# Patient Record
Sex: Female | Born: 1995 | Race: Black or African American | Hispanic: No | Marital: Single | Smoking: Never smoker
Health system: Southern US, Community
[De-identification: ages and names within clinical notes are randomized; demographics above are authoritative.]

## PROBLEM LIST (undated history)

## (undated) DIAGNOSIS — R7303 Prediabetes: Secondary | ICD-10-CM

## (undated) HISTORY — DX: Prediabetes: R73.03

## (undated) HISTORY — PX: ANTERIOR CRUCIATE LIGAMENT REPAIR: SHX115

## (undated) HISTORY — PX: CHOLECYSTECTOMY: SHX55

---

## 2021-06-05 ENCOUNTER — Encounter: Payer: Self-pay | Admitting: Emergency Medicine

## 2021-06-05 ENCOUNTER — Emergency Department: Payer: BC Managed Care – PPO

## 2021-06-05 ENCOUNTER — Other Ambulatory Visit: Payer: Self-pay

## 2021-06-05 DIAGNOSIS — U071 COVID-19: Secondary | ICD-10-CM | POA: Diagnosis not present

## 2021-06-05 DIAGNOSIS — R0602 Shortness of breath: Secondary | ICD-10-CM | POA: Diagnosis present

## 2021-06-05 LAB — LACTIC ACID, PLASMA: Lactic Acid, Venous: 1.6 mmol/L (ref 0.5–1.9)

## 2021-06-05 LAB — BASIC METABOLIC PANEL
Anion gap: 5 (ref 5–15)
BUN: 8 mg/dL (ref 6–20)
CO2: 25 mmol/L (ref 22–32)
Calcium: 8.7 mg/dL — ABNORMAL LOW (ref 8.9–10.3)
Chloride: 108 mmol/L (ref 98–111)
Creatinine, Ser: 0.69 mg/dL (ref 0.44–1.00)
GFR, Estimated: >60 mL/min (ref >60)
Glucose, Bld: 116 mg/dL — ABNORMAL HIGH (ref 70–99)
Potassium: 3.3 mmol/L — ABNORMAL LOW (ref 3.5–5.1)
Sodium: 138 mmol/L (ref 135–145)

## 2021-06-05 LAB — CBC
HCT: 28.1 % — ABNORMAL LOW (ref 36.0–46.0)
Hemoglobin: 8.1 g/dL — ABNORMAL LOW (ref 12.0–15.0)
MCH: 19.8 pg — ABNORMAL LOW (ref 26.0–34.0)
MCHC: 28.8 g/dL — ABNORMAL LOW (ref 30.0–36.0)
MCV: 68.5 fL — ABNORMAL LOW (ref 80.0–100.0)
Platelets: 379 10*3/uL (ref 150–400)
RBC: 4.1 MIL/uL (ref 3.87–5.11)
RDW: 17.8 % — ABNORMAL HIGH (ref 11.5–15.5)
WBC: 7.2 10*3/uL (ref 4.0–10.5)
nRBC: 0 % (ref 0.0–0.2)

## 2021-06-05 LAB — TROPONIN I (HIGH SENSITIVITY): Troponin I (High Sensitivity): 3 ng/L (ref <18)

## 2021-06-05 NOTE — ED Triage Notes (Signed)
Pt arrived via POV with reports of COVID + on 6/13, pt states sxs began 6/12, pt c/o shortness of breath at rest and with exertion worse since last night. Pt states she used albuterol nebs with no improvement.  Pt reports fevers at home 102 yesterday.   Pt c/o chest tightness as well.  Pt able to speak in complete sentences without running out of breath.

## 2021-06-06 ENCOUNTER — Emergency Department
Admission: EM | Admit: 2021-06-06 | Discharge: 2021-06-06 | Disposition: A | Discharge status: Home / Self Care | Payer: BC Managed Care – PPO | Attending: Emergency Medicine | Admitting: Emergency Medicine

## 2021-06-06 DIAGNOSIS — R0602 Shortness of breath: Secondary | ICD-10-CM

## 2021-06-06 DIAGNOSIS — U071 COVID-19: Secondary | ICD-10-CM

## 2021-06-06 NOTE — ED Notes (Signed)
Patient denies pain and is resting comfortably.  

## 2021-06-06 NOTE — ED Provider Notes (Signed)
[facility] Emergency Department Provider Note   ____________________________________________   Event Date/Time   First MD Initiated Contact with Patient 06/06/21 0133     (approximate)  I have reviewed the triage vital signs and the nursing notes.   HISTORY  Chief Complaint Shortness of Breath and Covid Positive (Home test on 06/02/21)    HPI Jodi Mckay is a 25 y.o. female with the below stated past medical history presents for shortness of breath in the setting of recent COVID diagnosis.  Patient states that she has had worsening cough, fever, and shortness of breath since 6/12.  Patient was seen via a televisit on the 13th and prescribed Paxlovid.  Patient states that despite taking this medication on time and as prescribed, her symptoms have continued to worsen.  Patient states that they acutely worsened tonight and she presented to the emergency department.  Patient also endorses associated nausea/vomiting/diarrhea that has resolved.  Patient currently denies any vision changes, tinnitus, difficulty speaking, facial droop, sore throat, chest pain, abdominal pain, dysuria, or weakness/numbness/paresthesias in any extremity         Past Medical History:  Diagnosis Date   Prediabetes     There are no problems to display for this patient.   Past Surgical History:  Procedure Laterality Date   ANTERIOR CRUCIATE LIGAMENT REPAIR     CHOLECYSTECTOMY      Prior to Admission medications   Not on File    Allergies Patient has no known allergies.  No family history on file.  Social History Social History   Tobacco Use   Smoking status: Never   Smokeless tobacco: Never  Vaping Use   Vaping Use: Never used    Review of Systems Constitutional: No fever/chills Eyes: No visual changes. ENT: No sore throat. Cardiovascular: Denies chest pain. Respiratory: Endorses shortness of breath. Gastrointestinal: No abdominal pain.  Endorses  nausea/vomiting/diarrhea that has resolved Genitourinary: Negative for dysuria. Musculoskeletal: Endorses generalized arthralgias Skin: Negative for rash. Neurological: Negative for headaches, weakness/numbness/paresthesias in any extremity Psychiatric: Negative for suicidal ideation/homicidal ideation   ____________________________________________   PHYSICAL EXAM:  VITAL SIGNS: ED Triage Vitals  Enc Vitals Group     BP 06/05/21 2304 123/79     Pulse Rate 06/05/21 2304 76     Resp 06/05/21 2304 18     Temp 06/05/21 2304 98.2 F (36.8 C)     Temp Source 06/05/21 2304 Oral     SpO2 06/05/21 2304 100 %     Weight 06/05/21 2310 (!) 310 lb (140.6 kg)     Height 06/05/21 2310 5\' 4"  (1.626 m)     Head Circumference --      Peak Flow --      Pain Score 06/05/21 2310 9     Pain Loc --      Pain Edu? --      Excl. in GC? --    Constitutional: Alert and oriented. Well appearing obese African-American female in no acute distress. Eyes: Conjunctivae are normal. PERRL. Head: Atraumatic. Nose: No congestion/rhinnorhea. Mouth/Throat: Mucous membranes are moist. Neck: No stridor Cardiovascular: Grossly normal heart sounds.  Good peripheral circulation. Respiratory: Normal respiratory effort.  No retractions.  Clear to auscultation bilaterally Gastrointestinal: Soft and nontender. No distention. Musculoskeletal: No obvious deformities Neurologic:  Normal speech and language. No gross focal neurologic deficits are appreciated. Skin:  Skin is warm and dry. No rash noted. Psychiatric: Mood and affect are normal. Speech and behavior are normal.  ____________________________________________  LABS (all labs ordered are listed, but only abnormal results are displayed)  Labs Reviewed  BASIC METABOLIC PANEL - Abnormal; Notable for the following components:      Result Value   Potassium 3.3 (*)    Glucose, Bld 116 (*)    Calcium 8.7 (*)    All other components within normal limits  CBC  - Abnormal; Notable for the following components:   Hemoglobin 8.1 (*)    HCT 28.1 (*)    MCV 68.5 (*)    MCH 19.8 (*)    MCHC 28.8 (*)    RDW 17.8 (*)    All other components within normal limits  LACTIC ACID, PLASMA  POC URINE PREG, ED  TROPONIN I (HIGH SENSITIVITY)  TROPONIN I (HIGH SENSITIVITY)   ____________________________________________  EKG  ED ECG REPORT I, Merwyn Katos, the attending physician, personally viewed and interpreted this ECG.  Date: 06/06/2021 EKG Time: 2310 Rate: 75 Rhythm: normal sinus rhythm QRS Axis: normal Intervals: normal ST/T Wave abnormalities: normal Narrative Interpretation: no evidence of acute ischemia  ____________________________________________  RADIOLOGY  ED MD interpretation: 2 view chest x-ray shows no evidence of acute abnormalities including no pneumonia, pneumothorax, or widened mediastinum  Official radiology report(s): DG Chest 2 View  Result Date: 06/05/2021 CLINICAL DATA:  Chest pain and shortness of breath.  Positive COVID. EXAM: CHEST - 2 VIEW COMPARISON:  None. FINDINGS: The heart size and mediastinal contours are within normal limits. Both lungs are clear. The visualized skeletal structures are unremarkable. IMPRESSION: No active cardiopulmonary disease. Electronically Signed   By: Burman Nieves M.D.   On: 06/05/2021 23:33    ____________________________________________   PROCEDURES  Procedure(s) performed (including Critical Care):  .1-3 Lead EKG Interpretation  Date/Time: 06/06/2021 1:49 AM Performed by: Merwyn Katos, MD Authorized by: Merwyn Katos, MD     Interpretation: normal     ECG rate:  77   ECG rate assessment: normal     Rhythm: sinus rhythm     Ectopy: none     Conduction: normal     ____________________________________________   INITIAL IMPRESSION / ASSESSMENT AND PLAN / ED COURSE  As part of my medical decision making, I reviewed the following data within the electronic  MEDICAL RECORD NUMBER Nursing notes reviewed and incorporated, Labs reviewed, EKG interpreted, Old chart reviewed, Radiograph reviewed and Notes from prior ED visits reviewed and incorporated        Presentation most consistent with Viral Syndrome.  Patient has tested positive for COVID-19 prior to arrival. Based on vitals and exam they are nontoxic and stable for discharge.  Given History and Exam I have a lower suspicion for: Emergent CardioPulmonary causes [such as Acute Asthma or COPD Exacerbation, acute Heart Failure or exacerbation, PE, PTX, atypical ACS, PNA]. Emergent Otolaryngeal causes [such as PTA, RPA, Ludwigs, Epiglottitis, EBV].  Regarding Emergent Travel or Immunosuppressive related infectious: I have a low suspicion for acute HIV.  Will provide strict return precautions and instructions on self-isolation/quarantine and anticipatory guidance.      ____________________________________________   FINAL CLINICAL IMPRESSION(S) / ED DIAGNOSES  Final diagnoses:  COVID-19 virus infection  SOB (shortness of breath)     ED Discharge Orders     None        Note:  This document was prepared using Dragon voice recognition software and may include unintentional dictation errors.    Merwyn Katos, MD 06/06/21 [phone]

## 2022-04-10 ENCOUNTER — Other Ambulatory Visit: Payer: Self-pay | Admitting: Obstetrics and Gynecology

## 2022-04-10 DIAGNOSIS — N644 Mastodynia: Secondary | ICD-10-CM

## 2022-04-27 ENCOUNTER — Other Ambulatory Visit: Payer: BC Managed Care – PPO

## 2022-04-29 ENCOUNTER — Other Ambulatory Visit: Payer: BC Managed Care – PPO

## 2022-05-04 ENCOUNTER — Ambulatory Visit
Admission: RE | Admit: 2022-05-04 | Discharge: 2022-05-04 | Disposition: A | Discharge status: Home / Self Care | Payer: BC Managed Care – PPO | Source: Ambulatory Visit | Attending: Obstetrics and Gynecology | Admitting: Obstetrics and Gynecology

## 2022-05-04 DIAGNOSIS — N644 Mastodynia: Secondary | ICD-10-CM | POA: Insufficient documentation

## 2022-06-30 ENCOUNTER — Other Ambulatory Visit: Payer: Self-pay | Admitting: Physician Assistant

## 2022-06-30 ENCOUNTER — Ambulatory Visit
Admission: RE | Admit: 2022-06-30 | Discharge: 2022-06-30 | Disposition: A | Discharge status: Home / Self Care | Payer: BC Managed Care – PPO | Source: Ambulatory Visit | Attending: Physician Assistant | Admitting: Physician Assistant

## 2022-06-30 DIAGNOSIS — R071 Chest pain on breathing: Secondary | ICD-10-CM | POA: Diagnosis present

## 2022-06-30 DIAGNOSIS — R0789 Other chest pain: Secondary | ICD-10-CM | POA: Diagnosis present

## 2022-06-30 DIAGNOSIS — M549 Dorsalgia, unspecified: Secondary | ICD-10-CM

## 2022-06-30 MED ORDER — IOHEXOL 350 MG/ML SOLN
75.0000 mL | Freq: Once | INTRAVENOUS | Status: AC | PRN
  Administered 2022-06-30: 75 mL via INTRAVENOUS

## 2022-07-09 ENCOUNTER — Other Ambulatory Visit: Payer: Self-pay | Admitting: Family Medicine

## 2022-07-09 DIAGNOSIS — N6312 Unspecified lump in the right breast, upper inner quadrant: Secondary | ICD-10-CM

## 2022-07-28 ENCOUNTER — Other Ambulatory Visit: Payer: BC Managed Care – PPO

## 2022-07-30 ENCOUNTER — Other Ambulatory Visit: Payer: BC Managed Care – PPO

## 2022-08-03 ENCOUNTER — Ambulatory Visit
Admission: RE | Admit: 2022-08-03 | Discharge: 2022-08-03 | Disposition: A | Discharge status: Home / Self Care | Payer: BC Managed Care – PPO | Source: Ambulatory Visit | Attending: Family Medicine | Admitting: Family Medicine

## 2022-08-03 DIAGNOSIS — N6312 Unspecified lump in the right breast, upper inner quadrant: Secondary | ICD-10-CM | POA: Diagnosis present

## 2022-08-19 ENCOUNTER — Ambulatory Visit: Payer: BC Managed Care – PPO | Admitting: Physical Therapy

## 2022-08-25 ENCOUNTER — Encounter: Payer: BC Managed Care – PPO | Admitting: Physical Therapy

## 2022-08-27 ENCOUNTER — Encounter: Payer: BC Managed Care – PPO | Admitting: Physical Therapy

## 2022-08-31 ENCOUNTER — Encounter: Payer: BC Managed Care – PPO | Admitting: Physical Therapy

## 2022-09-04 ENCOUNTER — Ambulatory Visit: Payer: BC Managed Care – PPO | Attending: Family Medicine | Admitting: Physical Therapy

## 2022-09-04 ENCOUNTER — Encounter: Payer: BC Managed Care – PPO | Admitting: Physical Therapy

## 2022-09-04 DIAGNOSIS — M546 Pain in thoracic spine: Secondary | ICD-10-CM | POA: Insufficient documentation

## 2022-09-04 DIAGNOSIS — M542 Cervicalgia: Secondary | ICD-10-CM | POA: Diagnosis present

## 2022-09-04 NOTE — Therapy (Signed)
OUTPATIENT PHYSICAL THERAPY THORACOLUMBAR EVALUATION   Patient Name: Jodi Mckay MRN: [id number] DOB:1996-10-10, 26 y.o., female Today's Date: 09/04/2022   PT End of Session - 09/04/22 1018     Visit Number 1    Number of Visits 17    Date for PT Re-Evaluation 11/06/22    Authorization - Visit Number 1    Authorization - Number of Visits 10    PT Start Time 1007    PT Stop Time 1045    PT Time Calculation (min) 38 min    Activity Tolerance Patient tolerated treatment well    Behavior During Therapy WFL for tasks assessed/performed             Past Medical History:  Diagnosis Date   Prediabetes    Past Surgical History:  Procedure Laterality Date   ANTERIOR CRUCIATE LIGAMENT REPAIR     CHOLECYSTECTOMY     There are no problems to display for this patient.   PCP: Nemiah Commander MD  REFERRING PROVIDER: Meeler FNP  REFERRING DIAG: midback pain   Rationale for Evaluation and Treatment Rehabilitation  THERAPY DIAG:  Pain in thoracic spine  Cervicalgia  ONSET DATE: July 1st 2023  SUBJECTIVE:                                                                                                                                                                                           SUBJECTIVE STATEMENT: Upper mid back spasms insidious onset  PERTINENT HISTORY:  Pt is a 26 year old female presenting with mid back spasms starting July 1 insidious onset. Pt reports she saw MD for this, and trouble breathing, diagnosis of costochondritis- reports this has resolved to date, still having mid back pain/spasms. Pt reports feeling spasms at random, but does think sitting and laying still make it worse- cannot give time frame. Current pain 6/10; lowest 3/10; worst 10/10. Denies n/t sensation. Has mid cervical and L sided cervical occasionally when her back is bothering her. Reports she was doing zumba classes prior to this back pain episode and would like to return to this. Was  completing this 3-4x/week at [employer]. 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   PAIN:  Are you having pain? Yes: NPRS scale: 6/10 Pain location: mid upper back; midline Pain description: cramping Aggravating factors: sitting or laying for a long time, zumba class, sleeping Relieving factors: heat   PRECAUTIONS: None  WEIGHT BEARING RESTRICTIONS No  FALLS:  Has patient fallen in last 6 months? No  LIVING ENVIRONMENT: Lives with: lives alone Lives in:  House/apartment Stairs: Yes: Internal: 14 steps; on right going up Has following equipment at home: None  OCCUPATION: Full time as an Insurance underwriter  PLOF: Independent  PATIENT GOALS Get back into the gym   OBJECTIVE:   DIAGNOSTIC FINDINGS:  Xray tspine normal  PATIENT SURVEYS:  FOTO 58 goal  91  SCREENING FOR RED FLAGS: Bowel or bladder incontinence: No Spinal tumors: No Cauda equina syndrome: No Compression fracture: No Abdominal aneurysm: No  COGNITION:  Overall cognitive status: Within functional limits for tasks assessed     SENSATION: WFL  MUSCLE LENGTH: Pec minor shortened bilat Bicep WNL bilat  POSTURE: rounded shoulders, forward head, and increased thoracic kyphosis  PALPATION: Concordant pain to palpation of bilat romboid minor/mid trap fibers Secondary pain and trigger points to bilat pec minor Latent trigger points to bilat UT and levator  LUMBO-THORACIC ROM:   Active / PROM A/PROM  eval  Flexion WNL (concordant pain in sitting and standing)  Extension WNL  Right lateral flexion WNL  Left lateral flexion WNL  Right rotation WNL  Left rotation WNL   (Blank rows = not tested)  UPPER EXTREMITY ROM:     All bilat shoulder and scapular mobility WNL- concordant pain sign with BILAT UE overhead flex, and BILAT scap retraction Cervical mobility WNL pain free   UPPER EXTREMITY MMT:    MMT Right eval Left eval   Shoulder flexion 5 5  Shoulder  extension 5 5  Shoulder  abduction 5 5  Shoulder  adduction 5 5  Shoulder  internal rotation 5 5  Shoulder external rotation 5 5  Y lower trap  4 4  T scapular retractors 4+ 4+   (Blank rows = not tested)   Observation   Scapular dyskinesis with overhead reach with excessive upward rotation and elevation  GAIT: Distance walked: 42m Assistive device utilized: None Level of assistance: Complete Independence Comments: maintained FHRS posture throughout; normal speed    TODAY'S TREATMENT  PT reviewed the following HEP with patient with patient able to demonstrate a set of the following with min cuing for correction needed. PT educated patient on parameters of therex (how/when to inc/decrease intensity, frequency, rep/set range, stretch hold time, and purpose of therex) with verbalized understanding.  Access Code: [email or web address]  - Seated Scapular Retraction  - 8 x daily - 7 x weekly - 12-20 reps - 2sec hold - Seated Shoulder Circles  - 8 x daily - 7 x weekly - 12-20 reps - Doorway Pec Stretch at 90 Degrees Abduction  - 3 x daily - 7 x weekly - 30-60sec hold   PATIENT EDUCATION:  Education details: Patient was educated on diagnosis, anatomy and pathology involved, prognosis, role of PT, and was given an HEP, demonstrating exercise with proper form following verbal and tactile cues, and was given a paper hand out to continue exercise at home. Pt was educated on and agreed to plan of care. Person educated: Patient Education method: Explanation, Demonstration, and Verbal cues Education comprehension: verbalized understanding and returned demonstration   HOME EXERCISE PROGRAM: [email or web address]  ASSESSMENT:  CLINICAL IMPRESSION: Patient is a 26 y.o. female who was seen today for physical therapy evaluation and treatment for thoracic pain and muscle spasms.  Postural dysfunction most likely contributor to this. Patient with impairments in abnormal length/tension  relationship (increased pec minor and occipital tension; decreased scapular retractor and deep cervical flexor activation/strength), decreased activity tolerance, and pain. 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 Would benefit from skilled PT to address above deficits and promote optimal return to PLOF.    OBJECTIVE IMPAIRMENTS Abnormal gait, decreased activity tolerance, decreased endurance, decreased mobility, decreased strength, increased fascial restrictions, increased muscle spasms, impaired tone, impaired UE functional use, improper body mechanics, postural dysfunction, and pain.   ACTIVITY LIMITATIONS carrying, lifting, bending, sitting, standing, bed mobility, and reach over head  PARTICIPATION LIMITATIONS: meal prep, cleaning, driving, shopping, community activity, and occupation  PERSONAL FACTORS [employer]itness, Time since onset of injury/illness/exacerbation, and 1 comorbidity: prediabetic  are also affecting patient's functional outcome.   REHAB POTENTIAL: Good  CLINICAL DECISION MAKING: Stable/uncomplicated  EVALUATION COMPLEXITY: Low   GOALS: Goals reviewed with patient? Yes  SHORT TERM GOALS: Target date: 10/02/2022  Pt will be independent with HEP in order to improve strength and balance in order to decrease fall risk and improve function at home and work. Baseline: HEP given Goal status: INITIAL  LONG TERM GOALS: Target date: 10/30/2022  Patient will increase FOTO score to 71 to demonstrate predicted increase in functional mobility to complete ADLs  Baseline: 58 Goal status: INITIAL  2.  Pt will decrease worst pain as reported on NPRS by at least 3 points in order to demonstrate clinically significant reduction in pain in order to complete work day  Baseline: 10/10 Goal status: INITIAL  3.  Pt will increase lower trap strength to 5/5 MMT  grade in order to demonstrate improvement in strength and function for heavy household ADLs  Baseline: 4/5 bilat Goal status: INITIAL    PLAN: PT FREQUENCY: 1-2x/week  PT DURATION: 8 weeks  PLANNED INTERVENTIONS: Therapeutic exercises, Therapeutic activity, Neuromuscular re-education, Balance training, Gait training, Patient/Family education, Self Care, Joint mobilization, Joint manipulation, Aquatic Therapy, Dry Needling, Electrical stimulation, Spinal manipulation, Spinal mobilization, Cryotherapy, Moist heat, Taping, Traction, Ultrasound, Manual therapy, and Re-evaluation.  PLAN FOR NEXT SESSION: HEP review; DNF endurance test  Hilda Lias DPT  Hilda Lias, PT 09/04/2022, 10:53 AM

## 2022-09-07 ENCOUNTER — Ambulatory Visit: Payer: BC Managed Care – PPO | Admitting: Physical Therapy

## 2022-09-09 ENCOUNTER — Encounter: Payer: Self-pay | Admitting: Physical Therapy

## 2022-09-09 ENCOUNTER — Ambulatory Visit: Payer: BC Managed Care – PPO | Admitting: Physical Therapy

## 2022-09-09 DIAGNOSIS — M542 Cervicalgia: Secondary | ICD-10-CM

## 2022-09-09 DIAGNOSIS — M546 Pain in thoracic spine: Secondary | ICD-10-CM | POA: Diagnosis not present

## 2022-09-09 NOTE — Therapy (Signed)
OUTPATIENT PHYSICAL THERAPY THORACOLUMBAR EVALUATION   Patient Name: Jodi Mckay MRN: [id number] DOB:Sep 01, 1996, 26 y.o., female Today's Date: 09/09/2022   PT End of Session - 09/09/22 1525     Visit Number 2    Number of Visits 17    Date for PT Re-Evaluation 11/06/22    Authorization - Visit Number 2    Authorization - Number of Visits 10    PT Start Time 0320    PT Stop Time 0400    PT Time Calculation (min) 40 min    Activity Tolerance Patient tolerated treatment well    Behavior During Therapy WFL for tasks assessed/performed              Past Medical History:  Diagnosis Date   Prediabetes    Past Surgical History:  Procedure Laterality Date   ANTERIOR CRUCIATE LIGAMENT REPAIR     CHOLECYSTECTOMY     There are no problems to display for this patient.   PCP: Nemiah Commander MD  REFERRING PROVIDER: Meeler FNP  REFERRING DIAG: midback pain   Rationale for Evaluation and Treatment Rehabilitation  THERAPY DIAG:  Pain in thoracic spine  Cervicalgia  ONSET DATE: July 1st 2023  SUBJECTIVE:                                                                                                                                                                                           SUBJECTIVE STATEMENT: Pt reports4/10 pain today. She has been completing HEP, thinks this is helpful. She enjoys stretching exercises.   PERTINENT HISTORY:  Pt is a 26 year old female presenting with mid back spasms starting July 1 insidious onset. Pt reports she saw MD for this, and trouble breathing, diagnosis of costochondritis- reports this has resolved to date, still having mid back pain/spasms. Pt reports feeling spasms at random, but does think sitting and laying still make it worse- cannot give time frame. Current pain 6/10; lowest 3/10; worst 10/10. Denies n/t sensation. Has mid cervical and L sided cervical occasionally when her back is bothering her. Reports she was doing zumba classes  prior to this back pain episode and would like to return to this. Was completing this 3-4x/week at [employer]. 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   PAIN:  Are you having pain? Yes: NPRS scale: 6/10 Pain location: mid upper back; midline Pain description: cramping Aggravating factors: sitting or laying for a long time, zumba class, sleeping Relieving factors: heat   PRECAUTIONS: None  WEIGHT BEARING RESTRICTIONS No  FALLS:  Has patient fallen in  last 6 months? No  LIVING ENVIRONMENT: Lives with: lives alone Lives in: House/apartment Stairs: Yes: Internal: 14 steps; on right going up Has following equipment at home: None  OCCUPATION: Full time as an Agricultural consultant  PLOF: Independent  PATIENT GOALS Get back into the gym   OBJECTIVE:   DIAGNOSTIC FINDINGS:  Xray tspine normal  PATIENT SURVEYS:  FOTO 58 goal  71  SCREENING FOR RED FLAGS: Bowel or bladder incontinence: No Spinal tumors: No Cauda equina syndrome: No Compression fracture: No Abdominal aneurysm: No  COGNITION:  Overall cognitive status: Within functional limits for tasks assessed     SENSATION: WFL  MUSCLE LENGTH: Pec minor shortened bilat Bicep WNL bilat  POSTURE: rounded shoulders, forward head, and increased thoracic kyphosis  PALPATION: Concordant pain to palpation of bilat romboid minor/mid trap fibers Secondary pain and trigger points to bilat pec minor Latent trigger points to bilat UT and levator  LUMBO-THORACIC ROM:   Active / PROM A/PROM  eval  Flexion WNL (concordant pain in sitting and standing)  Extension WNL  Right lateral flexion WNL  Left lateral flexion WNL  Right rotation WNL  Left rotation WNL   (Blank rows = not tested)  UPPER EXTREMITY ROM:     All bilat shoulder and scapular mobility WNL- concordant pain sign with BILAT UE overhead flex, and BILAT scap retraction Cervical mobility WNL  pain free   UPPER EXTREMITY MMT:    MMT Right eval Left eval  Shoulder flexion 5 5  Shoulder  extension 5 5  Shoulder  abduction 5 5  Shoulder  adduction 5 5  Shoulder  internal rotation 5 5  Shoulder external rotation 5 5  Y lower trap  4 4  T scapular retractors 4+ 4+   (Blank rows = not tested)   Observation   Scapular dyskinesis with overhead reach with excessive upward rotation and elevation  GAIT: Distance walked: 35m Assistive device utilized: None Level of assistance: Complete Independence Comments: maintained FHRS posture throughout; normal speed    TODAY'S TREATMENT  Nustep seat 9 UE 11 L3 35mins for gentle protraction/retraction and strengthening  Prone Y x10; with 1# DB 2x 10 with good carry over of cuing for scapulohumeral rhythm  BirdDog 2x 12 with good carry over of max cuing needed for demo   Bilat ER GTB standing at wall for postural cuing 3x 10 with good carry over of initial demo and cuing  Scapular retractions x12 with cuing needed to prevent shoulder hiking with decent carry over  Seated overhead scaption with dowel with thoracic ext over towel x12 with cuing for breath control Post shoulder rolls x12 Doorway pec stretch x30sec     PATIENT EDUCATION:  Education details: Patient was educated on diagnosis, anatomy and pathology involved, prognosis, role of PT, and was given an HEP, demonstrating exercise with proper form following verbal and tactile cues, and was given a paper hand out to continue exercise at home. Pt was educated on and agreed to plan of care. Person educated: Patient Education method: Explanation, Demonstration, and Verbal cues Education comprehension: verbalized understanding and returned demonstration   HOME EXERCISE PROGRAM: [email or web address]  ASSESSMENT:  CLINICAL IMPRESSION: PT initiated therex progression for increased thoracic mobility, periscapular strength and scapulohumeral rhythm with success. Patient is able to comply  with all cuing for proper technique of therex with good effort throughout session. Pt with minimal corrections of HEP needed with good carry over of these. Would benefit from skilled PT to address deficits and  promote optimal return to PLOF.     OBJECTIVE IMPAIRMENTS Abnormal gait, decreased activity tolerance, decreased endurance, decreased mobility, decreased strength, increased fascial restrictions, increased muscle spasms, impaired tone, impaired UE functional use, improper body mechanics, postural dysfunction, and pain.   ACTIVITY LIMITATIONS carrying, lifting, bending, sitting, standing, bed mobility, and reach over head  PARTICIPATION LIMITATIONS: meal prep, cleaning, driving, shopping, community activity, and occupation  PERSONAL FACTORS Fitness, Time since onset of injury/illness/exacerbation, and 1 comorbidity: prediabetic  are also affecting patient's functional outcome.   REHAB POTENTIAL: Good  CLINICAL DECISION MAKING: Stable/uncomplicated  EVALUATION COMPLEXITY: Low   GOALS: Goals reviewed with patient? Yes  SHORT TERM GOALS: Target date: 10/07/2022  Pt will be independent with HEP in order to improve strength and balance in order to decrease fall risk and improve function at home and work. Baseline: HEP given Goal status: INITIAL  LONG TERM GOALS: Target date: 11/04/2022  Patient will increase FOTO score to 71 to demonstrate predicted increase in functional mobility to complete ADLs  Baseline: 58 Goal status: INITIAL  2.  Pt will decrease worst pain as reported on NPRS by at least 3 points in order to demonstrate clinically significant reduction in pain in order to complete work day  Baseline: 10/10 Goal status: INITIAL  3.  Pt will increase lower trap strength to 5/5 MMT grade in order to demonstrate improvement in strength and function for heavy household ADLs  Baseline: 4/5 bilat Goal status: INITIAL    PLAN: PT FREQUENCY: 1-2x/week  PT DURATION: 8  weeks  PLANNED INTERVENTIONS: Therapeutic exercises, Therapeutic activity, Neuromuscular re-education, Balance training, Gait training, Patient/Family education, Self Care, Joint mobilization, Joint manipulation, Aquatic Therapy, Dry Needling, Electrical stimulation, Spinal manipulation, Spinal mobilization, Cryotherapy, Moist heat, Taping, Traction, Ultrasound, Manual therapy, and Re-evaluation.  PLAN FOR NEXT SESSION: HEP review; DNF endurance test  Hilda Lias DPT  Hilda Lias, PT 09/09/2022, 3:56 PM

## 2022-09-14 ENCOUNTER — Ambulatory Visit: Payer: BC Managed Care – PPO | Admitting: Physical Therapy

## 2022-09-17 ENCOUNTER — Ambulatory Visit: Payer: BC Managed Care – PPO

## 2022-09-17 DIAGNOSIS — M542 Cervicalgia: Secondary | ICD-10-CM

## 2022-09-17 DIAGNOSIS — M546 Pain in thoracic spine: Secondary | ICD-10-CM

## 2022-09-17 NOTE — Therapy (Signed)
OUTPATIENT PHYSICAL THERAPY TREATMENT   Patient Name: Jodi Mckay MRN: [id number] DOB:06-13-1996, 26 y.o., female Today's Date: 09/17/2022   PT End of Session - 09/17/22 1353     Visit Number 3    Number of Visits 17    Date for PT Re-Evaluation 11/06/22    Authorization Type BCBS COMM Pro    Progress Note Due on Visit 10    PT Start Time 1348    PT Stop Time 1428    PT Time Calculation (min) 40 min    Activity Tolerance Patient tolerated treatment well;No increased pain    Behavior During Therapy WFL for tasks assessed/performed              Past Medical History:  Diagnosis Date   Prediabetes    Past Surgical History:  Procedure Laterality Date   ANTERIOR CRUCIATE LIGAMENT REPAIR     CHOLECYSTECTOMY     There are no problems to display for this patient.   PCP: Nemiah Commander MD REFERRING PROVIDER: Meeler FNP REFERRING DIAG: midback pain  Rationale for Evaluation and Treatment Rehabilitation  THERAPY DIAG:  Pain in thoracic spine  Cervicalgia  ONSET DATE: July 1st 2023 SUBJECTIVE:                                                                                                                                                                                           SUBJECTIVE STATEMENT: Pt reports 4/10 pain today. She has been completing HEP, thinks this is helpful. She enjoys stretching exercises. Pt has no other updates since prior PT visit.   PERTINENT HISTORY:  Pt is a 26 year old female presenting with mid back spasms starting July 1 insidious onset. Pt reports she saw MD for this, and trouble breathing, diagnosis of costochondritis- reports this has resolved to date, still having mid back pain/spasms. Pt reports feeling spasms at random, but does think sitting and laying still make it worse- cannot give time frame. Current pain 6/10; lowest 3/10; worst 10/10. Denies n/t sensation. Has mid cervical and L sided cervical occasionally when her back is bothering  her. Reports she was doing zumba classes prior to this back pain episode and would like to return to this. Was completing this 3-4x/week at [employer]. 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   PAIN:  Are you having pain? Yes: NPRS scale: 4/10 Pain location: mid upper back; midline Pain description:   Aggravating factors: sitting or laying for a long time, zumba class, sleeping Relieving factors: heat  OBJECTIVE:  TODAY'S TREATMENT  Nustep seat  9 UE 11 L3 26mins for gentle protraction/retraction and strengthening  Scapular retractions 12x3secH (sittin' down) Post shoulder rolls x12 (sittin' down)  Doorway pec stretch x30sec [employer] 1x12 bilat, alternating    Bilat ER GTB standing back to wall for postural cuing 1x15 Cable Resistance Row 1x12 @ 30lb (sittin' down, gray chair) Omega Chest Press 1x12 @ 30lb  Standing against cubical wall --> BUE scaption overhead c giant blue physioball 1x12      Bilat ER GTB standing back to wall for postural cuing 1x15   Cable Resistance Row 1x12 @ 30lb (sittin' down, gray chair)   Omega Chest Press 1x12 @ 30lb    Prone Y with silver physioball 1x10   Supine Cervical retraction to neutral (upper T mobilization) 1x15x1secH    PATIENT EDUCATION:  Education details: Form for exercises and progression in loading Person educated: Patient Education method: Explanation, Demonstration, and Verbal cues Education comprehension: verbalized understanding and returned demonstration   HOME EXERCISE PROGRAM: [id number]  ASSESSMENT:  CLINICAL IMPRESSION: Able to advance diversity and volume of exercises today. Generally good tolerance regardless of position. No pain exacerbation in session. Pt reports spontaneous cavitation in area of concern during row, a good sign that joint mobility was finally achieved as desired. Noted improvement in proprioceptive awareness today. Would benefit from  skilled PT to address deficits and promote optimal return to PLOF.     OBJECTIVE IMPAIRMENTS Abnormal gait, decreased activity tolerance, decreased endurance, decreased mobility, decreased strength, increased fascial restrictions, increased muscle spasms, impaired tone, impaired UE functional use, improper body mechanics, postural dysfunction, and pain.   ACTIVITY LIMITATIONS carrying, lifting, bending, sitting, standing, bed mobility, and reach over head  PARTICIPATION LIMITATIONS: meal prep, cleaning, driving, shopping, community activity, and occupation  [place], Time since onset of injury/illness/exacerbation, and 1 comorbidity: prediabetic  are also affecting patient's functional outcome.   REHAB POTENTIAL: Good  CLINICAL DECISION MAKING: Stable/uncomplicated  EVALUATION COMPLEXITY: Low   GOALS: Goals reviewed with patient? Yes  SHORT TERM GOALS: Target date: 10/15/2022  Pt will be independent with HEP in order to improve strength and balance in order to decrease fall risk and improve function at home and work. Baseline: HEP given Goal status: INITIAL  LONG TERM GOALS: Target date: 11/12/2022  Patient will increase FOTO score to 71 to demonstrate predicted increase in functional mobility to complete ADLs  Baseline: 58 Goal status: INITIAL  2.  Pt will decrease worst pain as reported on NPRS by at least 3 points in order to demonstrate clinically significant reduction in pain in order to complete work day  Baseline: 10/10 Goal status: INITIAL  3.  Pt will increase lower trap strength to 5/5 MMT grade in order to demonstrate improvement in strength and function for heavy household ADLs  Baseline: 4/5 bilat Goal status: INITIAL    PLAN: PT FREQUENCY: 1-2x/week  PT DURATION: 8 weeks  PLANNED INTERVENTIONS: Therapeutic exercises, Therapeutic activity, Neuromuscular re-education, Balance training, Gait training, Patient/Family education, Self Care,  Joint mobilization, Joint manipulation, Aquatic Therapy, Dry Needling, Electrical stimulation, Spinal manipulation, Spinal mobilization, Cryotherapy, Moist heat, Taping, Traction, Ultrasound, Manual therapy, and Re-evaluation.  PLAN FOR NEXT SESSION: HEP review; DNF endurance test    2:26 PM, 09/17/22 Etta Grandchild, PT, DPT Physical Therapist - [place] [phone] (Office)   Jackson C, PT 09/17/2022, 1:55 PM

## 2022-09-22 ENCOUNTER — Encounter: Payer: BC Managed Care – PPO | Admitting: Physical Therapy

## 2022-09-25 ENCOUNTER — Encounter: Payer: BC Managed Care – PPO | Admitting: Physical Therapy

## 2022-09-29 ENCOUNTER — Encounter: Payer: BC Managed Care – PPO | Admitting: Physical Therapy

## 2022-10-01 ENCOUNTER — Encounter: Payer: Self-pay | Admitting: Physical Therapy

## 2022-10-01 ENCOUNTER — Encounter: Payer: BC Managed Care – PPO | Admitting: Physical Therapy

## 2022-10-01 ENCOUNTER — Ambulatory Visit: Payer: BC Managed Care – PPO | Attending: Family Medicine | Admitting: Physical Therapy

## 2022-10-01 DIAGNOSIS — M542 Cervicalgia: Secondary | ICD-10-CM | POA: Insufficient documentation

## 2022-10-01 DIAGNOSIS — M546 Pain in thoracic spine: Secondary | ICD-10-CM | POA: Insufficient documentation

## 2022-10-01 NOTE — Therapy (Signed)
OUTPATIENT PHYSICAL THERAPY TREATMENT   Patient Name: Jodi Mckay MRN: [id number] DOB:January 31, 1996, 26 y.o., female Today's Date: 10/01/2022   PT End of Session - 10/01/22 1350     Visit Number 4    Number of Visits 17    Date for PT Re-Evaluation 11/06/22    Authorization Type BCBS COMM Pro    Authorization - Visit Number 4    Authorization - Number of Visits 10    Progress Note Due on Visit 10    PT Start Time 1346    PT Stop Time 1424    PT Time Calculation (min) 38 min    Activity Tolerance Patient tolerated treatment well;No increased pain    Behavior During Therapy WFL for tasks assessed/performed               Past Medical History:  Diagnosis Date   Prediabetes    Past Surgical History:  Procedure Laterality Date   ANTERIOR CRUCIATE LIGAMENT REPAIR     CHOLECYSTECTOMY     There are no problems to display for this patient.   PCP: Nemiah Commander MD REFERRING PROVIDER: Meeler FNP REFERRING DIAG: midback pain  Rationale for Evaluation and Treatment Rehabilitation  THERAPY DIAG:  Pain in thoracic spine  Cervicalgia  ONSET DATE: July 1st 2023 SUBJECTIVE:                                                                                                                                                                                           SUBJECTIVE STATEMENT: Pt reports 2/10 pain today. She has been completing HEP, thinks this is helpful. She enjoys stretching exercises. Pt has no other updates since prior PT visit.   PERTINENT HISTORY:  Pt is a 26 year old female presenting with mid back spasms starting July 1 insidious onset. Pt reports she saw MD for this, and trouble breathing, diagnosis of costochondritis- reports this has resolved to date, still having mid back pain/spasms. Pt reports feeling spasms at random, but does think sitting and laying still make it worse- cannot give time frame. Current pain 6/10; lowest 3/10; worst 10/10. Denies n/t sensation.  Has mid cervical and L sided cervical occasionally when her back is bothering her. Reports she was doing zumba classes prior to this back pain episode and would like to return to this. Was completing this 3-4x/week at [employer]. 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   PAIN:  Are you having pain? Yes: NPRS scale: 4/10 Pain location: mid upper back; midline Pain description:   Aggravating factors: sitting or laying  for a long time, zumba class, sleeping Relieving factors: heat  OBJECTIVE:  TODAY'S TREATMENT  Nustep seat 9 UE 11 L3 51mins for gentle protraction/retraction and strengthening  Y on total gym incline 2# DB 3x 12 with cuing for scapulohumeral rhythm with success  OMEGA seated rows 3x 10 35# with cuing for eccentric control with good carry over   TRX pull up 2x 12 with good carry over of technique demo  BluTB bilat ER 2x 10 with good carry over of cuing for eccentric control  Post shoulder rolls x12 (sittin' down)  Doorway pec stretch x30sec    PATIENT EDUCATION:  Education details: Form for exercises and progression in loading Person educated: Patient Education method: Explanation, Demonstration, and Verbal cues Education comprehension: verbalized understanding and returned demonstration   HOME EXERCISE PROGRAM: [id number]  ASSESSMENT:  CLINICAL IMPRESSION: PT continued therex progression for increased postural awareness, scapulohumeral rhythm and periscapular strengthening with success. Patient is able to comply with all cuing for proper technique of therex with good effort throughout session. Patient with increased better carry over between sessions of familiar exercises. Would benefit from skilled PT to address deficits and promote optimal return to PLOF.     OBJECTIVE IMPAIRMENTS Abnormal gait, decreased activity tolerance, decreased endurance, decreased mobility, decreased strength, increased  fascial restrictions, increased muscle spasms, impaired tone, impaired UE functional use, improper body mechanics, postural dysfunction, and pain.   ACTIVITY LIMITATIONS carrying, lifting, bending, sitting, standing, bed mobility, and reach over head  PARTICIPATION LIMITATIONS: meal prep, cleaning, driving, shopping, community activity, and occupation  [place], Time since onset of injury/illness/exacerbation, and 1 comorbidity: prediabetic  are also affecting patient's functional outcome.   REHAB POTENTIAL: Good  CLINICAL DECISION MAKING: Stable/uncomplicated  EVALUATION COMPLEXITY: Low   GOALS: Goals reviewed with patient? Yes  SHORT TERM GOALS: Target date: 10/29/2022  Pt will be independent with HEP in order to improve strength and balance in order to decrease fall risk and improve function at home and work. Baseline: HEP given Goal status: INITIAL  LONG TERM GOALS: Target date: 11/26/2022  Patient will increase FOTO score to 71 to demonstrate predicted increase in functional mobility to complete ADLs  Baseline: 58 Goal status: INITIAL  2.  Pt will decrease worst pain as reported on NPRS by at least 3 points in order to demonstrate clinically significant reduction in pain in order to complete work day  Baseline: 10/10 Goal status: INITIAL  3.  Pt will increase lower trap strength to 5/5 MMT grade in order to demonstrate improvement in strength and function for heavy household ADLs  Baseline: 4/5 bilat Goal status: INITIAL    PLAN: PT FREQUENCY: 1-2x/week  PT DURATION: 8 weeks  PLANNED INTERVENTIONS: Therapeutic exercises, Therapeutic activity, Neuromuscular re-education, Balance training, Gait training, Patient/Family education, Self Care, Joint mobilization, Joint manipulation, Aquatic Therapy, Dry Needling, Electrical stimulation, Spinal manipulation, Spinal mobilization, Cryotherapy, Moist heat, Taping, Traction, Ultrasound, Manual therapy, and  Re-evaluation.  PLAN FOR NEXT SESSION: HEP review; DNF endurance test    2:28 PM, 10/01/22 Etta Grandchild, PT, DPT Physical Therapist - [place] [phone] (Office)   Durwin Reges, PT 10/01/2022, 2:28 PM

## 2022-10-05 ENCOUNTER — Ambulatory Visit: Payer: BC Managed Care – PPO | Admitting: Physical Therapy

## 2022-10-05 ENCOUNTER — Encounter: Payer: Self-pay | Admitting: Physical Therapy

## 2022-10-05 DIAGNOSIS — M546 Pain in thoracic spine: Secondary | ICD-10-CM | POA: Diagnosis not present

## 2022-10-05 NOTE — Therapy (Signed)
OUTPATIENT PHYSICAL THERAPY TREATMENT   Patient Name: Jodi Mckay MRN: [id number] DOB:Nov 20, 1996, 26 y.o., female Today's Date: 10/05/2022   PT End of Session - 10/05/22 0754     Visit Number 5    Number of Visits 17    Date for PT Re-Evaluation 11/06/22    Authorization Type BCBS COMM Pro    Authorization - Visit Number 5    Authorization - Number of Visits 10    Progress Note Due on Visit 10    PT Start Time 0750    PT Stop Time 0830    PT Time Calculation (min) 40 min    Activity Tolerance Patient tolerated treatment well;No increased pain    Behavior During Therapy WFL for tasks assessed/performed                Past Medical History:  Diagnosis Date   Prediabetes    Past Surgical History:  Procedure Laterality Date   ANTERIOR CRUCIATE LIGAMENT REPAIR     CHOLECYSTECTOMY     There are no problems to display for this patient.   PCP: Tressia Miners MD REFERRING PROVIDER: Meeler FNP REFERRING DIAG: midback pain  Rationale for Evaluation and Treatment Rehabilitation  THERAPY DIAG:  Pain in thoracic spine  ONSET DATE: July 1st 2023 SUBJECTIVE:                                                                                                                                                                                           SUBJECTIVE STATEMENT: Pt reports she didn't do much this weekend, reporting minimal pain with this. Is doing well overall, feels 50% better since starting PT.   PERTINENT HISTORY:  Pt is a 26 year old female presenting with mid back spasms starting July 1 insidious onset. Pt reports she saw MD for this, and trouble breathing, diagnosis of costochondritis- reports this has resolved to date, still having mid back pain/spasms. Pt reports feeling spasms at random, but does think sitting and laying still make it worse- cannot give time frame. Current pain 6/10; lowest 3/10; worst 10/10. Denies n/t sensation. Has mid cervical and L sided cervical  occasionally when her back is bothering her. Reports she was doing zumba classes prior to this back pain episode and would like to return to this. Was completing this 3-4x/week at [employer]. 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   PAIN:  Are you having pain? Yes: NPRS scale: 4/10 Pain location: mid upper back; midline Pain description:   Aggravating factors: sitting or laying for a long time, zumba  class, sleeping Relieving factors: heat  OBJECTIVE:  TODAY'S TREATMENT  Nustep seat 9 UE 11 L3 32mins for gentle protraction/retraction and strengthening  OMEGA pull up L11 2x 6 ; L12 x6 with good carry over of initial demo   OMEGA bent over rows 3x 10 25# with cuing for eccentric control with good carry over     Deadlift 30# KB 3x 8/7/6 with excellent carry over of demo for technique  Thoracic ext over towel roll x12 Post shoulder rolls x12 (sittin' down)  Doorway pec stretch x30sec    PATIENT EDUCATION:  Education details: Form for exercises and progression in loading Person educated: Patient Education method: Explanation, Demonstration, and Verbal cues Education comprehension: verbalized understanding and returned demonstration   HOME EXERCISE PROGRAM: [id number]  ASSESSMENT:  CLINICAL IMPRESSION: PT continued therex progression for increased postural awareness, scapulohumeral rhythm and periscapular strengthening with success. Patient is able to comply with all cuing for proper technique of therex with good effort throughout session. Patient with increased better carry over between sessions of familiar exercises. Would benefit from skilled PT to address deficits and promote optimal return to PLOF.     OBJECTIVE IMPAIRMENTS Abnormal gait, decreased activity tolerance, decreased endurance, decreased mobility, decreased strength, increased fascial restrictions, increased muscle spasms, impaired tone, impaired UE  functional use, improper body mechanics, postural dysfunction, and pain.   ACTIVITY LIMITATIONS carrying, lifting, bending, sitting, standing, bed mobility, and reach over head  PARTICIPATION LIMITATIONS: meal prep, cleaning, driving, shopping, community activity, and occupation  [place], Time since onset of injury/illness/exacerbation, and 1 comorbidity: prediabetic  are also affecting patient's functional outcome.   REHAB POTENTIAL: Good  CLINICAL DECISION MAKING: Stable/uncomplicated  EVALUATION COMPLEXITY: Low   GOALS: Goals reviewed with patient? Yes  SHORT TERM GOALS: Target date: 11/02/2022  Pt will be independent with HEP in order to improve strength and balance in order to decrease fall risk and improve function at home and work. Baseline: HEP given Goal status: INITIAL  LONG TERM GOALS: Target date: 11/30/2022  Patient will increase FOTO score to 71 to demonstrate predicted increase in functional mobility to complete ADLs  Baseline: 58 Goal status: INITIAL  2.  Pt will decrease worst pain as reported on NPRS by at least 3 points in order to demonstrate clinically significant reduction in pain in order to complete work day  Baseline: 10/10 Goal status: INITIAL  3.  Pt will increase lower trap strength to 5/5 MMT grade in order to demonstrate improvement in strength and function for heavy household ADLs  Baseline: 4/5 bilat Goal status: INITIAL    PLAN: PT FREQUENCY: 1-2x/week  PT DURATION: 8 weeks  PLANNED INTERVENTIONS: Therapeutic exercises, Therapeutic activity, Neuromuscular re-education, Balance training, Gait training, Patient/Family education, Self Care, Joint mobilization, Joint manipulation, Aquatic Therapy, Dry Needling, Electrical stimulation, Spinal manipulation, Spinal mobilization, Cryotherapy, Moist heat, Taping, Traction, Ultrasound, Manual therapy, and Re-evaluation.  PLAN FOR NEXT SESSION: HEP review; DNF endurance  test    8:31 AM, 10/05/22 Etta Grandchild, PT, DPT Physical Therapist - [place] [phone] (Office)   Durwin Reges, PT 10/05/2022, 8:31 AM

## 2022-10-08 ENCOUNTER — Ambulatory Visit: Payer: BC Managed Care – PPO | Admitting: Physical Therapy

## 2022-10-12 ENCOUNTER — Ambulatory Visit: Payer: BC Managed Care – PPO | Admitting: Physical Therapy

## 2022-10-12 ENCOUNTER — Encounter: Payer: Self-pay | Admitting: Physical Therapy

## 2022-10-12 DIAGNOSIS — M546 Pain in thoracic spine: Secondary | ICD-10-CM | POA: Diagnosis not present

## 2022-10-12 NOTE — Therapy (Signed)
OUTPATIENT PHYSICAL THERAPY TREATMENT   Patient Name: Jodi Mckay MRN: [id number] DOB:Feb 27, 1996, 26 y.o., female Today's Date: 10/12/2022   PT End of Session - 10/12/22 0800     Visit Number 6    Number of Visits 17    Date for PT Re-Evaluation 11/06/22    Authorization - Visit Number 6    Authorization - Number of Visits 10    Progress Note Due on Visit 10    PT Start Time 0750    PT Stop Time 0830    PT Time Calculation (min) 40 min    Activity Tolerance Patient tolerated treatment well;No increased pain    Behavior During Therapy WFL for tasks assessed/performed                 Past Medical History:  Diagnosis Date   Prediabetes    Past Surgical History:  Procedure Laterality Date   ANTERIOR CRUCIATE LIGAMENT REPAIR     CHOLECYSTECTOMY     There are no problems to display for this patient.   PCP: Nemiah Commander MD REFERRING PROVIDER: Meeler FNP REFERRING DIAG: midback pain  Rationale for Evaluation and Treatment Rehabilitation  THERAPY DIAG:  Pain in thoracic spine  ONSET DATE: July 1st 2023 SUBJECTIVE:                                                                                                                                                                                           SUBJECTIVE STATEMENT: Pt reports she didn't do much this weekend, reporting minimal pain with this. Pt reports 8/10 soreness in between scapula upon presentation. Is doing well overall, feels 60% better since starting PT.   PERTINENT HISTORY:  Pt is a 26 year old female presenting with mid back spasms starting July 1 insidious onset. Pt reports she saw MD for this, and trouble breathing, diagnosis of costochondritis- reports this has resolved to date, still having mid back pain/spasms. Pt reports feeling spasms at random, but does think sitting and laying still make it worse- cannot give time frame. Current pain 6/10; lowest 3/10; worst 10/10. Denies n/t sensation. Has mid  cervical and L sided cervical occasionally when her back is bothering her. Reports she was doing zumba classes prior to this back pain episode and would like to return to this. Was completing this 3-4x/week at [employer]. 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   PAIN:  Are you having pain? Yes: NPRS scale: 4/10 Pain location: mid upper back; midline Pain description:   Aggravating factors: sitting or laying for a long  time, zumba class, sleeping Relieving factors: heat  OBJECTIVE:  TODAY'S TREATMENT  Nustep seat 9 UE 11 L3 71mins for gentle protraction/retraction and strengthening  Total Gym pull up L11 2x 6 ; L13 x6 with good carry over of initial demo   TRX Shoulder Rows 2 sets, 10 reps pt requires cueing to maintain knee extension  OMEGA bent over rows 1x 10 25#, 2x 8 35# with cuing for eccentric control with good carry over     Deadlift 30# KB 2x 8 reps with excellent carry over of demo for technique   KB Swings 20# 10 reps pt soreness noted following strengthening exercises  Sitting shoulder bracing 10 reps pt cued for proper scapular retraction and cervical extension Post shoulder rolls x12 (sittin' down, after strengthening exercises)      PATIENT EDUCATION:  Education details: Form for exercises and progression in loading Person educated: Patient Education method: Explanation, Demonstration, and Verbal cues Education comprehension: verbalized understanding and returned demonstration   HOME EXERCISE PROGRAM: [id number]  ASSESSMENT:  CLINICAL IMPRESSION: PT continued therex progression for increased postural awareness, scapulohumeral rhythm and periscapular strengthening with success. Patient is able to comply with all cuing for proper technique of therex with good effort throughout session. Patient with increased better carry over between sessions of familiar exercises. Brief review/demo of KB deadlifts  and TRX rows required for pt completion of therapy. Would benefit from skilled PT to address deficits and promote optimal return to PLOF.     OBJECTIVE IMPAIRMENTS Abnormal gait, decreased activity tolerance, decreased endurance, decreased mobility, decreased strength, increased fascial restrictions, increased muscle spasms, impaired tone, impaired UE functional use, improper body mechanics, postural dysfunction, and pain.   ACTIVITY LIMITATIONS carrying, lifting, bending, sitting, standing, bed mobility, and reach over head  PARTICIPATION LIMITATIONS: meal prep, cleaning, driving, shopping, community activity, and occupation  [place], Time since onset of injury/illness/exacerbation, and 1 comorbidity: prediabetic  are also affecting patient's functional outcome.   REHAB POTENTIAL: Good  CLINICAL DECISION MAKING: Stable/uncomplicated  EVALUATION COMPLEXITY: Low   GOALS: Goals reviewed with patient? Yes  SHORT TERM GOALS: Target date: 11/09/2022  Pt will be independent with HEP in order to improve strength and balance in order to decrease fall risk and improve function at home and work. Baseline: HEP given Goal status: INITIAL  LONG TERM GOALS: Target date: 12/07/2022  Patient will increase FOTO score to 71 to demonstrate predicted increase in functional mobility to complete ADLs  Baseline: 58 Goal status: INITIAL  2.  Pt will decrease worst pain as reported on NPRS by at least 3 points in order to demonstrate clinically significant reduction in pain in order to complete work day  Baseline: 10/10 Goal status: INITIAL  3.  Pt will increase lower trap strength to 5/5 MMT grade in order to demonstrate improvement in strength and function for heavy household ADLs  Baseline: 4/5 bilat Goal status: INITIAL    PLAN: PT FREQUENCY: 1-2x/week  PT DURATION: 8 weeks  PLANNED INTERVENTIONS: Therapeutic exercises, Therapeutic activity, Neuromuscular re-education,  Balance training, Gait training, Patient/Family education, Self Care, Joint mobilization, Joint manipulation, Aquatic Therapy, Dry Needling, Electrical stimulation, Spinal manipulation, Spinal mobilization, Cryotherapy, Moist heat, Taping, Traction, Ultrasound, Manual therapy, and Re-evaluation.  PLAN FOR NEXT SESSION: HEP review; DNF endurance test    11:10 AM, 10/12/22 Etta Grandchild, PT, DPT Physical Therapist - [place] [phone] (Office)   Durwin Reges, PT 10/12/2022, 11:10 AM

## 2022-10-14 ENCOUNTER — Encounter: Payer: Self-pay | Admitting: Physical Therapy

## 2022-10-14 ENCOUNTER — Ambulatory Visit: Payer: BC Managed Care – PPO | Admitting: Physical Therapy

## 2022-10-14 DIAGNOSIS — M546 Pain in thoracic spine: Secondary | ICD-10-CM

## 2022-10-14 DIAGNOSIS — M542 Cervicalgia: Secondary | ICD-10-CM

## 2022-10-14 NOTE — Therapy (Signed)
OUTPATIENT PHYSICAL THERAPY TREATMENT   Patient Name: Jodi Mckay MRN: [id number] DOB:1996-10-16, 26 y.o., female Today's Date: 10/15/2022   PT End of Session - 10/14/22 0752     Visit Number 7    Number of Visits 17    Date for PT Re-Evaluation 11/06/22    Authorization Type BCBS COMM Pro    Authorization - Visit Number 7    Authorization - Number of Visits 10    Progress Note Due on Visit 10    PT Start Time [phone]    PT Stop Time 0831    PT Time Calculation (min) 39 min    Activity Tolerance Patient tolerated treatment well;No increased pain    Behavior During Therapy WFL for tasks assessed/performed                  Past Medical History:  Diagnosis Date   Prediabetes    Past Surgical History:  Procedure Laterality Date   ANTERIOR CRUCIATE LIGAMENT REPAIR     CHOLECYSTECTOMY     There are no problems to display for this patient.   PCP: Nemiah Commander MD REFERRING PROVIDER: Meeler FNP REFERRING DIAG: midback pain  Rationale for Evaluation and Treatment Rehabilitation  THERAPY DIAG:  Pain in thoracic spine  Cervicalgia  ONSET DATE: July 1st 2023 SUBJECTIVE:                                                                                                                                                                                           SUBJECTIVE STATEMENT: Pt reports soreness subsiding since last PT visit, 6/10. Soreness in mid back and into arms as well. Pt works remotely from home most days.  PERTINENT HISTORY:  Pt is a 26 year old female presenting with mid back spasms starting July 1 insidious onset. Pt reports she saw MD for this, and trouble breathing, diagnosis of costochondritis- reports this has resolved to date, still having mid back pain/spasms. Pt reports feeling spasms at random, but does think sitting and laying still make it worse- cannot give time frame. Current pain 6/10; lowest 3/10; worst 10/10. Denies n/t sensation. Has mid cervical  and L sided cervical occasionally when her back is bothering her. Reports she was doing zumba classes prior to this back pain episode and would like to return to this. Was completing this 3-4x/week at [employer]. 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   PAIN:  Are you having pain? Yes: NPRS scale: 4/10 Pain location: mid upper back; midline Pain description:   Aggravating factors: sitting or laying  for a long time, zumba class, sleeping Relieving factors: heat  OBJECTIVE:  TODAY'S TREATMENT  Nustep seat 9 UE 11 L3 for gentle protraction/retraction and strengthening  Total Gym pull up L13 2x 6 ; L14 x4 with good carry over of initial demo   TRX Shoulder Rows 2 sets, 10 reps pt requires cueing to maintain knee extension  OMEGA bent over rows 1x 10 35#, 2x 8 45# with cuing for eccentric control with good carry over   Spiderman Thoracic Rotations Bilateral 1 set, 5 reps no resistance, 1 set 4# DB; pt reports right thoracic movement feels good.    Deadlift 30# KB 2x 8 reps with excellent carry over of demo for technique, R knee crepitus noted   Sitting shoulder bracing 10 reps pt cued for proper scapular retraction and cervical extension, one motion enforced Post shoulder rolls x12 (sittin' down, after strengthening exercises)      PATIENT EDUCATION:  Education details: Form for exercises and progression in loading Person educated: Patient Education method: Explanation, Demonstration, and Verbal cues Education comprehension: verbalized understanding and returned demonstration   HOME EXERCISE PROGRAM: [id number]  ASSESSMENT:  CLINICAL IMPRESSION:  PT continued therapeutic exercise variations for functional mobility and improved ergonomics for optimized thoracic spine movement. Pt tolerates increased resistance and new thoracic rotation exercises well. Pt reported right rotation feels good. Pt reports being  challenged by exercises in the clinic especially the total gym pull up. Pt continues cuing for scapular retraction and maintaining of neutral lumbar spine. Pt would continue to benefit from skilled PT to improve thoracic spine proper posture.    OBJECTIVE IMPAIRMENTS Abnormal gait, decreased activity tolerance, decreased endurance, decreased mobility, decreased strength, increased fascial restrictions, increased muscle spasms, impaired tone, impaired UE functional use, improper body mechanics, postural dysfunction, and pain.   ACTIVITY LIMITATIONS carrying, lifting, bending, sitting, standing, bed mobility, and reach over head  PARTICIPATION LIMITATIONS: meal prep, cleaning, driving, shopping, community activity, and occupation  PERSONAL FACTORS [employer], Time since onset of injury/illness/exacerbation, and 1 comorbidity: prediabetic  are also affecting patient's functional outcome.   REHAB POTENTIAL: Good  CLINICAL DECISION MAKING: Stable/uncomplicated  EVALUATION COMPLEXITY: Low   GOALS: Goals reviewed with patient? Yes  SHORT TERM GOALS: Target date: 11/12/2022  Pt will be independent with HEP in order to improve strength and balance in order to decrease fall risk and improve function at home and work. Baseline: HEP given Goal status: INITIAL  LONG TERM GOALS: Target date: 12/10/2022  Patient will increase FOTO score to 71 to demonstrate predicted increase in functional mobility to complete ADLs  Baseline: 58 Goal status: INITIAL  2.  Pt will decrease worst pain as reported on NPRS by at least 3 points in order to demonstrate clinically significant reduction in pain in order to complete work day  Baseline: 10/10 Goal status: INITIAL  3.  Pt will increase lower trap strength to 5/5 MMT grade in order to demonstrate improvement in strength and function for heavy household ADLs  Baseline: 4/5 bilat Goal status: INITIAL    PLAN: PT FREQUENCY: 1-2x/week  PT DURATION: 8  weeks  PLANNED INTERVENTIONS: Therapeutic exercises, Therapeutic activity, Neuromuscular re-education, Balance training, Gait training, Patient/Family education, Self Care, Joint mobilization, Joint manipulation, Aquatic Therapy, Dry Needling, Electrical stimulation, Spinal manipulation, Spinal mobilization, Cryotherapy, Moist heat, Taping, Traction, Ultrasound, Manual therapy, and Re-evaluation.  PLAN FOR NEXT SESSION: HEP review; Continue progression of therex, progress quadruped activities    11:38 AM, 10/15/22 Hilda Lias DPT Student physical therapist  under direct supervision of licensed physical therapist during the entirety of the session.   Edison Nasuti A. Laurance Flatten, SPT  Durwin Reges, PT 10/15/2022, 11:38 AM

## 2022-10-19 ENCOUNTER — Ambulatory Visit: Payer: BC Managed Care – PPO | Admitting: Physical Therapy

## 2022-10-19 ENCOUNTER — Encounter: Payer: Self-pay | Admitting: Physical Therapy

## 2022-10-19 DIAGNOSIS — M546 Pain in thoracic spine: Secondary | ICD-10-CM | POA: Diagnosis not present

## 2022-10-19 DIAGNOSIS — M542 Cervicalgia: Secondary | ICD-10-CM

## 2022-10-19 NOTE — Therapy (Signed)
OUTPATIENT PHYSICAL THERAPY TREATMENT   Patient Name: Jodi Mckay MRN: [id number] DOB:1996/09/13, 26 y.o., female Today's Date: 10/19/2022   PT End of Session - 10/19/22 0758     Visit Number 8    Number of Visits 17    Date for PT Re-Evaluation 11/06/22    Authorization Type BCBS COMM Pro    Authorization - Visit Number 8    Authorization - Number of Visits 10    Progress Note Due on Visit 10    PT Start Time 0756    PT Stop Time 0832    PT Time Calculation (min) 36 min    Activity Tolerance Patient tolerated treatment well;No increased pain    Behavior During Therapy WFL for tasks assessed/performed                   Past Medical History:  Diagnosis Date   Prediabetes    Past Surgical History:  Procedure Laterality Date   ANTERIOR CRUCIATE LIGAMENT REPAIR     CHOLECYSTECTOMY     There are no problems to display for this patient.   PCP: Nemiah Commander MD REFERRING PROVIDER: Meeler FNP REFERRING DIAG: midback pain  Rationale for Evaluation and Treatment Rehabilitation  THERAPY DIAG:  Pain in thoracic spine  Cervicalgia  ONSET DATE: July 1st 2023 SUBJECTIVE:                                                                                                                                                                                           SUBJECTIVE STATEMENT: Pt reports no pain in mid back this weekend. Pt feels sore in morning but just feels she needs to get her muscles warmed up. Soreness this morning 1/10.  PERTINENT HISTORY:  Pt is a 26 year old female presenting with mid back spasms starting July 1 insidious onset. Pt reports she saw MD for this, and trouble breathing, diagnosis of costochondritis- reports this has resolved to date, still having mid back pain/spasms. Pt reports feeling spasms at random, but does think sitting and laying still make it worse- cannot give time frame. Current pain 6/10; lowest 3/10; worst 10/10. Denies n/t sensation.  Has mid cervical and L sided cervical occasionally when her back is bothering her. Reports she was doing zumba classes prior to this back pain episode and would like to return to this. Was completing this 3-4x/week at [employer]. 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   PAIN:  Are you having pain? Yes: NPRS scale: 4/10 Pain location: mid upper back; midline Pain description:  Aggravating factors: sitting or laying for a long time, zumba class, sleeping Relieving factors: heat  OBJECTIVE:  TODAY'S TREATMENT  Nustep seat 9 UE 11 L4 36mins for gentle protraction/retraction and strengthening  Total Gym pull up L13 1x 6 ; L14 x6, 2 sets pt cued for eccentric control during lowering phase of exercise  OMEGA Seated Rows 1 set 45#, 8 reps; 2 sets, 55#, 8 reps; pt educated on appropriate resistance and number of reps  Spiderman Thoracic Rotations Bilateral 2 set 4# DB, 8 reps; pt demonstrates proper carry over from previous visit, pt reports left rotation is more challenging.    Deadlift 40# KB 2x 8 reps with excellent carry over of demo for technique, Pt cued for scapular retraction during the entire movement  Post shoulder rolls x12 (sittin' down, after strengthening exercises)      PATIENT EDUCATION:  Education details: Form for exercises and progression in loading Person educated: Patient Education method: Explanation, Demonstration, and Verbal cues Education comprehension: verbalized understanding and returned demonstration   HOME EXERCISE PROGRAM: [id number]  ASSESSMENT:  CLINICAL IMPRESSION: PT continued therapeutic exercise for functional mobilization of thoracic spine. Strengthening exercises were progressed with increases in resistance and pt demonstrated successful performance. Pt was unable to complete the full number of reps during the last set of total gym pull ups at level 14, good effort noted. Pt required  less cueing today for posture and eccentric control especially for familiar exercises. Pt will continue to benefit from skilled physical therapy to improve functional movements that involve thoracic spine.  OBJECTIVE IMPAIRMENTS Abnormal gait, decreased activity tolerance, decreased endurance, decreased mobility, decreased strength, increased fascial restrictions, increased muscle spasms, impaired tone, impaired UE functional use, improper body mechanics, postural dysfunction, and pain.   ACTIVITY LIMITATIONS carrying, lifting, bending, sitting, standing, bed mobility, and reach over head  PARTICIPATION LIMITATIONS: meal prep, cleaning, driving, shopping, community activity, and occupation  [place], Time since onset of injury/illness/exacerbation, and 1 comorbidity: prediabetic  are also affecting patient's functional outcome.   REHAB POTENTIAL: Good  CLINICAL DECISION MAKING: Stable/uncomplicated  EVALUATION COMPLEXITY: Low   GOALS: Goals reviewed with patient? Yes  SHORT TERM GOALS: Target date: 11/16/2022  Pt will be independent with HEP in order to improve strength and balance in order to decrease fall risk and improve function at home and work. Baseline: HEP given Goal status: INITIAL  LONG TERM GOALS: Target date: 12/14/2022  Patient will increase FOTO score to 71 to demonstrate predicted increase in functional mobility to complete ADLs  Baseline: 58 Goal status: INITIAL  2.  Pt will decrease worst pain as reported on NPRS by at least 3 points in order to demonstrate clinically significant reduction in pain in order to complete work day  Baseline: 10/10 Goal status: INITIAL  3.  Pt will increase lower trap strength to 5/5 MMT grade in order to demonstrate improvement in strength and function for heavy household ADLs  Baseline: 4/5 bilat Goal status: INITIAL    PLAN: PT FREQUENCY: 1-2x/week  PT DURATION: 8 weeks  PLANNED INTERVENTIONS:  Therapeutic exercises, Therapeutic activity, Neuromuscular re-education, Balance training, Gait training, Patient/Family education, Self Care, Joint mobilization, Joint manipulation, Aquatic Therapy, Dry Needling, Electrical stimulation, Spinal manipulation, Spinal mobilization, Cryotherapy, Moist heat, Taping, Traction, Ultrasound, Manual therapy, and Re-evaluation.  PLAN FOR NEXT SESSION: HEP review; Continue progression of therex, progress quadruped activities    10:29 AM, 10/19/22 Durwin Reges DPT Student physical therapist under direct supervision of licensed physical therapist during the entirety  of the session.   Gerilyn Pilgrim A. Christell Constant, SPT  Hilda Lias, PT 10/19/2022, 10:29 AM

## 2022-10-21 ENCOUNTER — Ambulatory Visit: Payer: BC Managed Care – PPO | Admitting: Physical Therapy

## 2022-10-26 ENCOUNTER — Encounter: Payer: Self-pay | Admitting: Physical Therapy

## 2022-10-26 ENCOUNTER — Ambulatory Visit: Payer: BC Managed Care – PPO | Attending: Family Medicine | Admitting: Physical Therapy

## 2022-10-26 DIAGNOSIS — M542 Cervicalgia: Secondary | ICD-10-CM | POA: Diagnosis present

## 2022-10-26 DIAGNOSIS — M546 Pain in thoracic spine: Secondary | ICD-10-CM | POA: Diagnosis present

## 2022-10-26 NOTE — Therapy (Signed)
OUTPATIENT PHYSICAL THERAPY TREATMENT/DC Summary Reporting Period 09/04/22 - 10/26/22   Patient Name: Jodi Mckay MRN: [id number] DOB:Mar 25, 1996, 26 y.o., female Today's Date: 10/26/2022   PT End of Session - 10/26/22 0842     Visit Number 9    Number of Visits 17    Date for PT Re-Evaluation 11/06/22    Authorization Type [employer] COMM [employer]    Authorization - Visit Number 9    Authorization - Number of Visits 10    Progress Note Due on Visit 10    PT Start Time 0840    PT Stop Time 0910    PT Time Calculation (min) 30 min    Activity Tolerance Patient tolerated treatment well;No increased pain    Behavior During Therapy WFL for tasks assessed/performed                    Past Medical History:  Diagnosis Date   Prediabetes    Past Surgical History:  Procedure Laterality Date   ANTERIOR CRUCIATE LIGAMENT REPAIR     CHOLECYSTECTOMY     There are no problems to display for this patient.   PCP: Tressia Miners MD REFERRING PROVIDER: Meeler FNP REFERRING DIAG: midback pain  Rationale for Evaluation and Treatment Rehabilitation  THERAPY DIAG:  Pain in thoracic spine  Cervicalgia  ONSET DATE: July 1st 2023 SUBJECTIVE:                                                                                                                                                                                           SUBJECTIVE STATEMENT: Pt reports no pain in mid back this weekend. Pt feels sore in morning but just feels she needs to get her muscles warmed up. Soreness this morning 1/10.  PERTINENT HISTORY:  Pt is a 26 year old female presenting with mid back spasms starting July 1 insidious onset. Pt reports she saw MD for this, and trouble breathing, diagnosis of costochondritis- reports this has resolved to date, still having mid back pain/spasms. Pt reports feeling spasms at random, but does think sitting and laying still make it worse- cannot give time frame. Current pain 6/10;  lowest 3/10; worst 10/10. Denies n/t sensation. Has mid cervical and L sided cervical occasionally when her back is bothering her. Reports she was doing zumba classes prior to this back pain episode and would like to return to this. Was completing this 3-4x/week at [employer]. 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   PAIN:  Are you having pain? Yes: NPRS scale: 4/10 Pain location: mid  upper back; midline Pain description:   Aggravating factors: sitting or laying for a long time, zumba class, sleeping Relieving factors: heat  OBJECTIVE:  TODAY'S TREATMENT  Nustep seat 9 UE 11 L4 84mns for gentle protraction/retraction and strengthening  PT reviewed the following HEP with patient with patient able to demonstrate a set of the following with min cuing for correction needed. PT educated patient on parameters of therex (how/when to inc/decrease intensity, frequency, rep/set range, stretch hold time, and purpose of therex) with verbalized understanding.   Access Code: [email or web address]- Seated Thoracic Lumbar Extension with Pectoralis Stretch  - 8 x daily - 7 x weekly - 12-20 reps - 2sec hold - Seated Shoulder Shrug Circles AROM Backward  - 8 x daily - 7 x weekly - 12-20 reps - 2sec hold - Doorway Pec Stretch at 90 Degrees Abduction  - 3 x daily - 7 x weekly - 30-60sec hold - Prone Scapular Retraction Y  - 1 x daily - 1-2 x weekly - 3 sets - 8-12 reps - Standing Shoulder External Rotation with Resistance  - 1 x daily - 1-2 x weekly - 3 sets - 8-12 reps - Seated Shoulder Horizontal Abduction with Resistance  - 1 x daily - 1-2 x weekly - 3 sets - 8-12 reps - Standing Shoulder Row with Anchored Resistance  - 1 x daily - 1-2 x weekly - 3 sets - 8-12 reps - Standard Plank  - 1 x daily - 1-2 x weekly - 3 reps - 15-60sec hold     PATIENT EDUCATION:  Education details: Form for exercises and progression in loading Person educated:  Patient Education method: Explanation, Demonstration, and Verbal cues Education comprehension: verbalized understanding and returned demonstration   HOME EXERCISE PROGRAM: [id number] ASSESSMENT:  CLINICAL IMPRESSION: PT reassessed goals this session where patient has met all goals to safely d/c formal PT. Patient is able to demonstrate and verbalize understanding of all HEP recommendations with minimal corrections needed. Pt given clinic contact info should further questions or concerns arise. Pt to d/c PT.    OBJECTIVE IMPAIRMENTS Abnormal gait, decreased activity tolerance, decreased endurance, decreased mobility, decreased strength, increased fascial restrictions, increased muscle spasms, impaired tone, impaired UE functional use, improper body mechanics, postural dysfunction, and pain.   ACTIVITY LIMITATIONS carrying, lifting, bending, sitting, standing, bed mobility, and reach over head  PARTICIPATION LIMITATIONS: meal prep, cleaning, driving, shopping, community activity, and occupation  [place] Time since onset of injury/illness/exacerbation, and 1 comorbidity: prediabetic  are also affecting patient's functional outcome.   REHAB POTENTIAL: Good  CLINICAL DECISION MAKING: Stable/uncomplicated  EVALUATION COMPLEXITY: Low   GOALS: Goals reviewed with patient? Yes  SHORT TERM GOALS: Target date: 11/23/2022  Pt will be independent with HEP in order to improve strength and balance in order to decrease fall risk and improve function at home and work. Baseline: HEP updated Goal status: MET  LONG TERM GOALS: Target date: 12/21/2022  Patient will increase FOTO score to 71 to demonstrate predicted increase in functional mobility to complete ADLs  Baseline: 58; 10/26/22 99 Goal status: MET  2.  Pt will decrease worst pain as reported on NPRS by at least 3 points in order to demonstrate clinically significant reduction in pain in order to complete work day   Baseline: 10/10; 10/26/22 0/10 Goal status: MET  3.  Pt will increase lower trap strength to 5/5 MMT grade in order to demonstrate improvement in strength and function for heavy household ADLs  Baseline:  4/5 bilat; 10/26/22 Goal status: MET    PLAN: PT FREQUENCY: 1-2x/week  PT DURATION: 8 weeks  PLANNED INTERVENTIONS: Therapeutic exercises, Therapeutic activity, Neuromuscular re-education, Balance training, Gait training, Patient/Family education, Self Care, Joint mobilization, Joint manipulation, Aquatic Therapy, Dry Needling, Electrical stimulation, Spinal manipulation, Spinal mobilization, Cryotherapy, Moist heat, Taping, Traction, Ultrasound, Manual therapy, and Re-evaluation.  PLAN FOR NEXT SESSION:  Durwin Reges DPT Durwin Reges, PT 10/26/2022, 9:13 AM

## 2023-12-30 IMAGING — US US BREAST*L* LIMITED INC AXILLA
1 series · 7 of 7 positions shown · non-contrast
Comparison: None Available.

CLINICAL DATA: 26 year old female presenting for evaluation of
diffuse breast pain throughout the lateral aspect of her left breast
for several months. The pain is intermittent but not associated with
her menstrual cycle. No change in hormone status.

EXAM:
ULTRASOUND OF THE LEFT BREAST

[Series 1: us breast*left* limited inc axilla · 0.07mm/px · 7 of 7 slices shown]
[im 1/7]
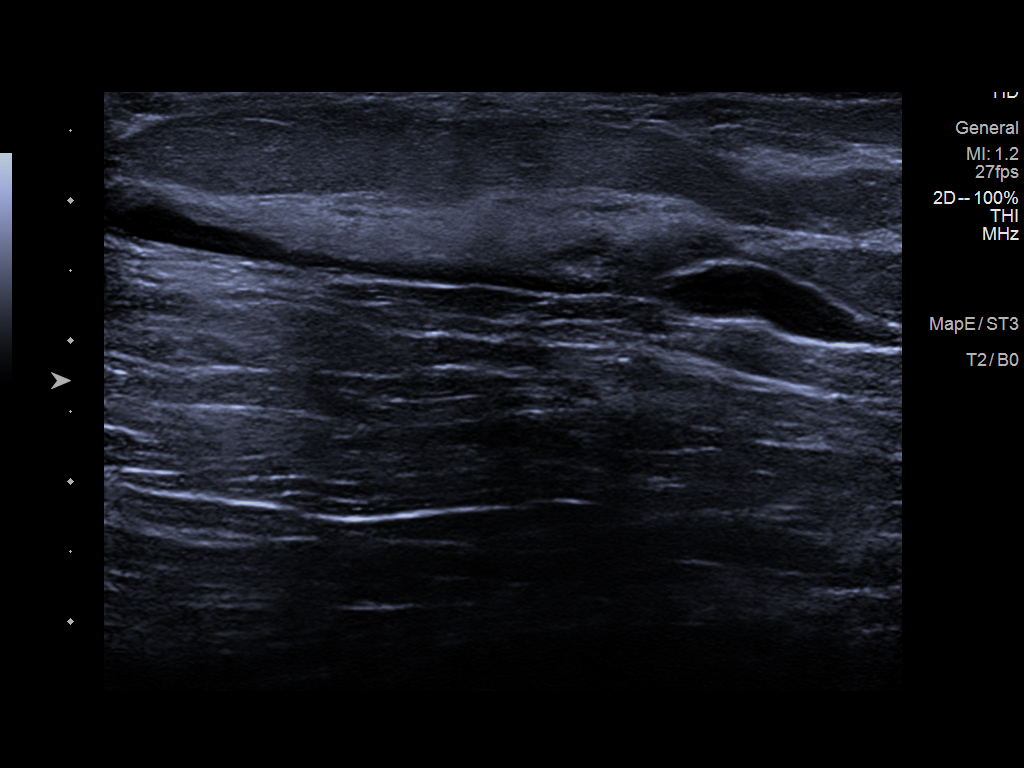
[im 2/7]
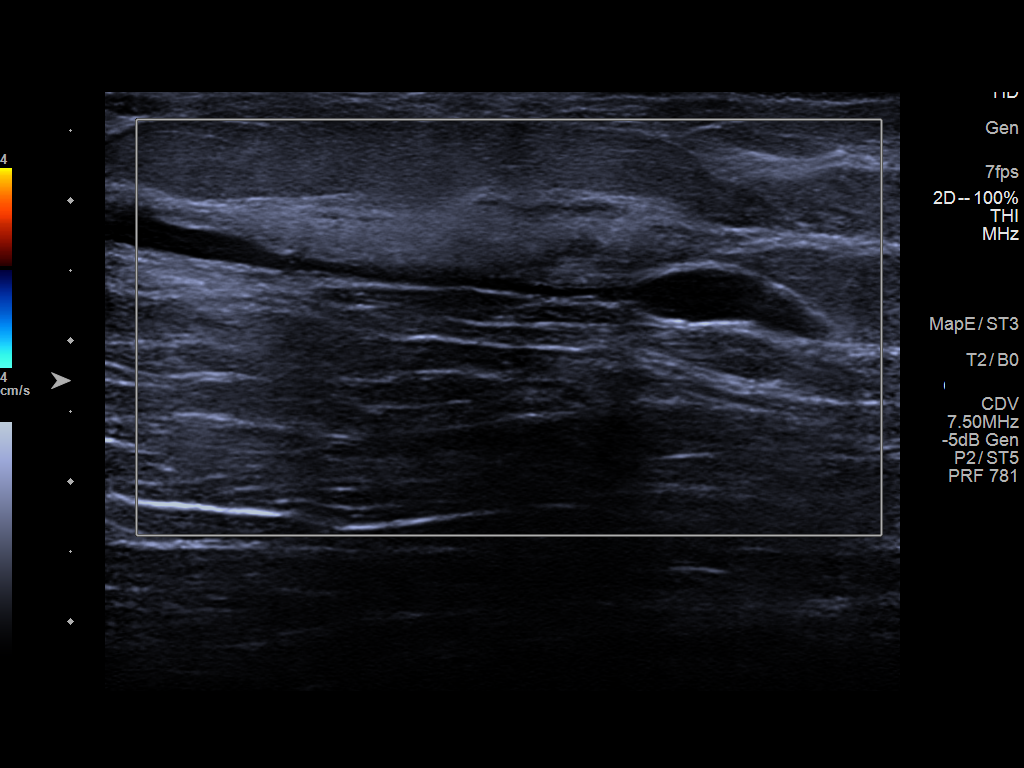
[im 3/7]
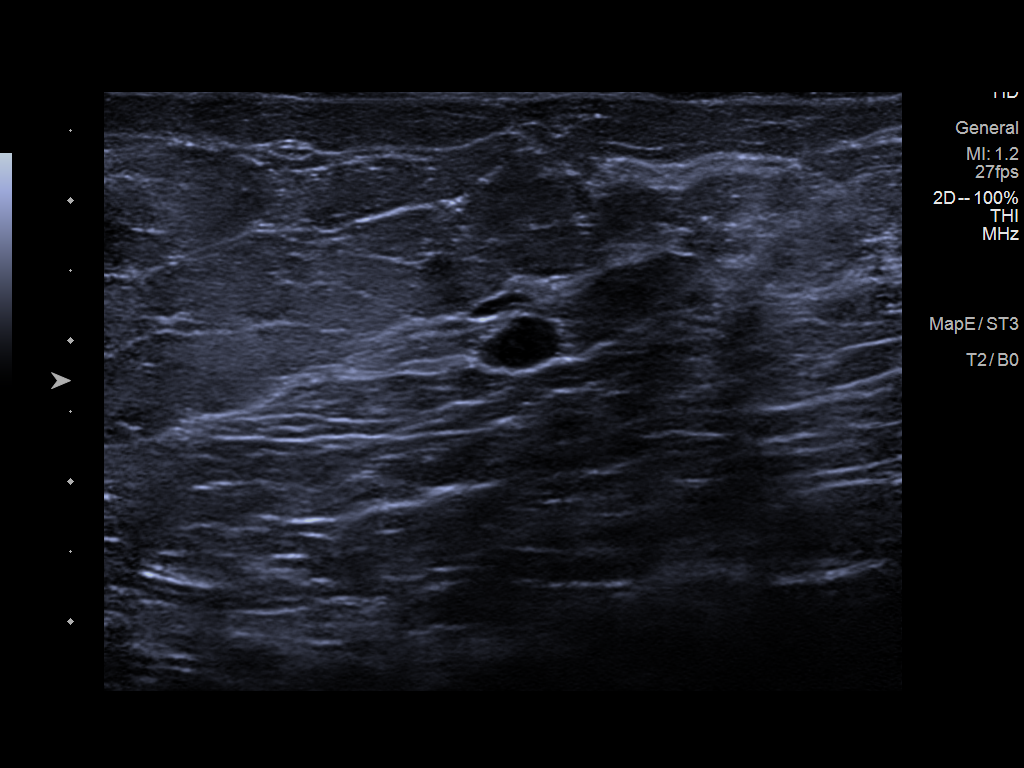
[im 4/7]
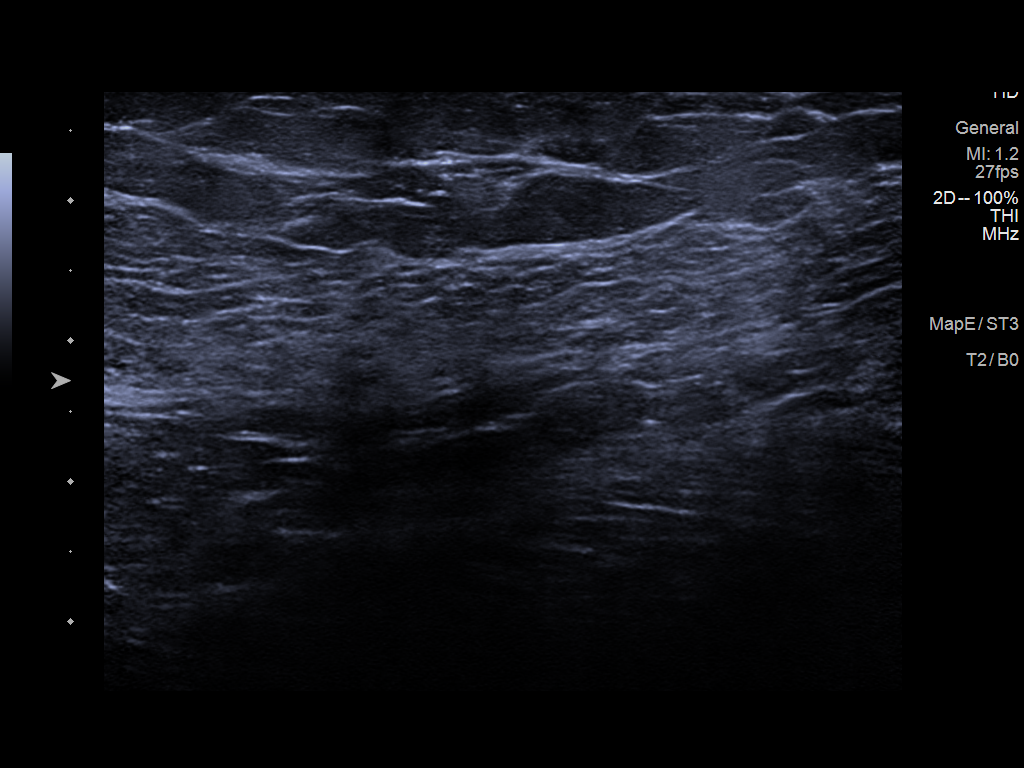
[im 5/7]
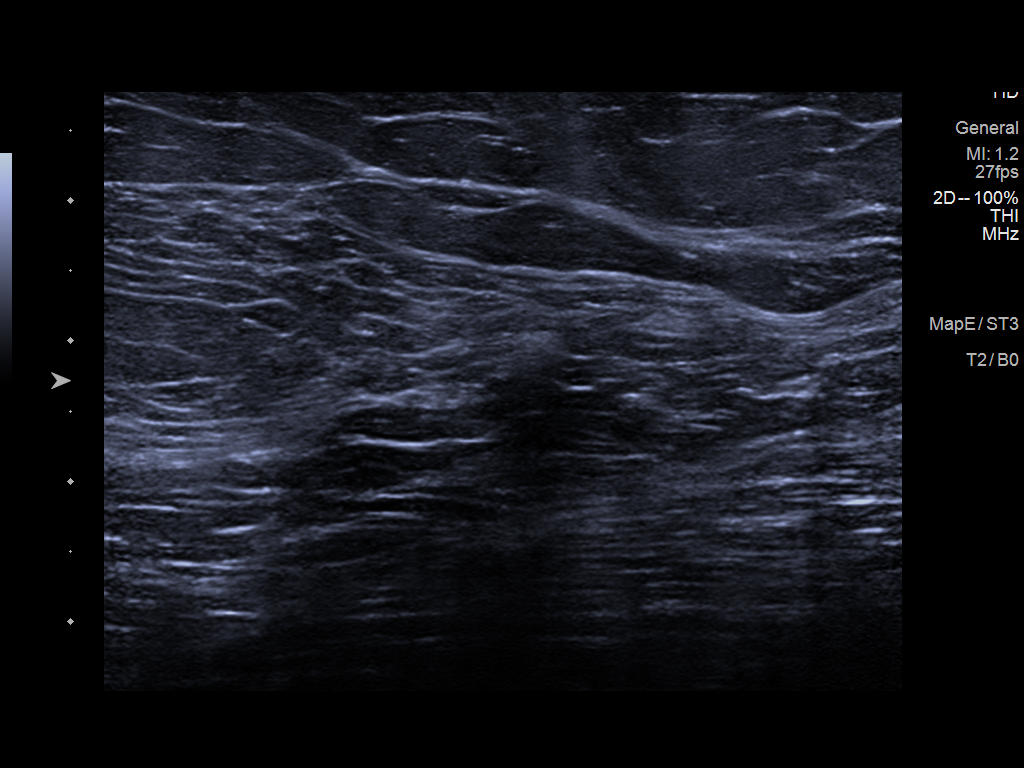
[im 6/7]
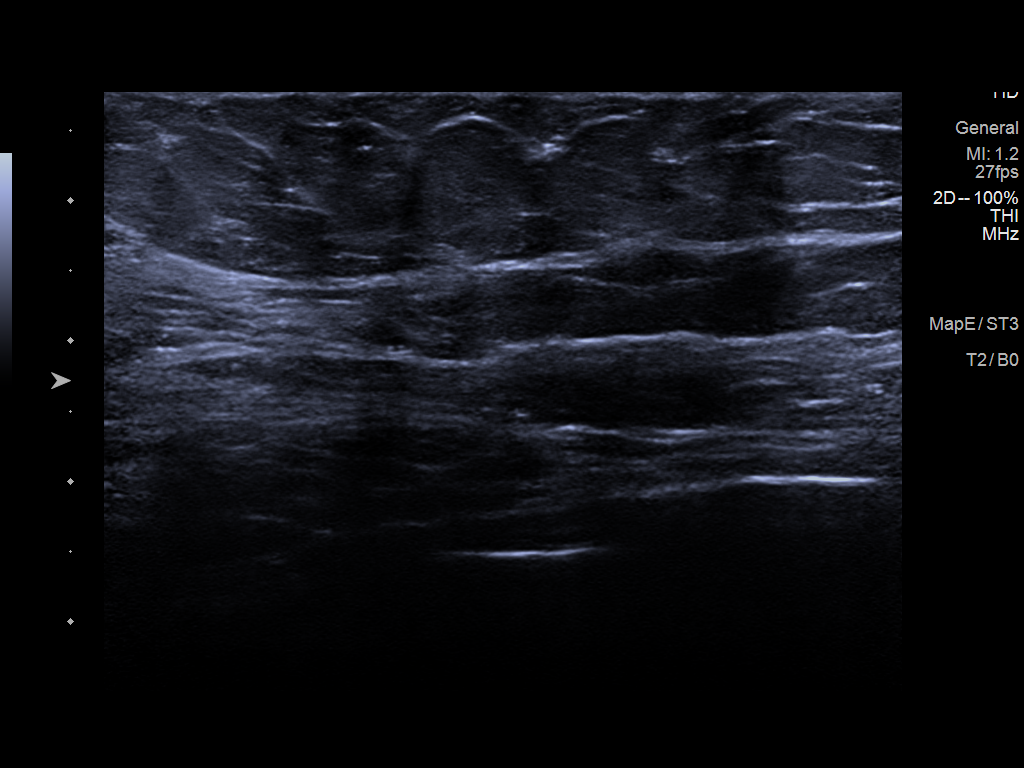
[im 7/7]
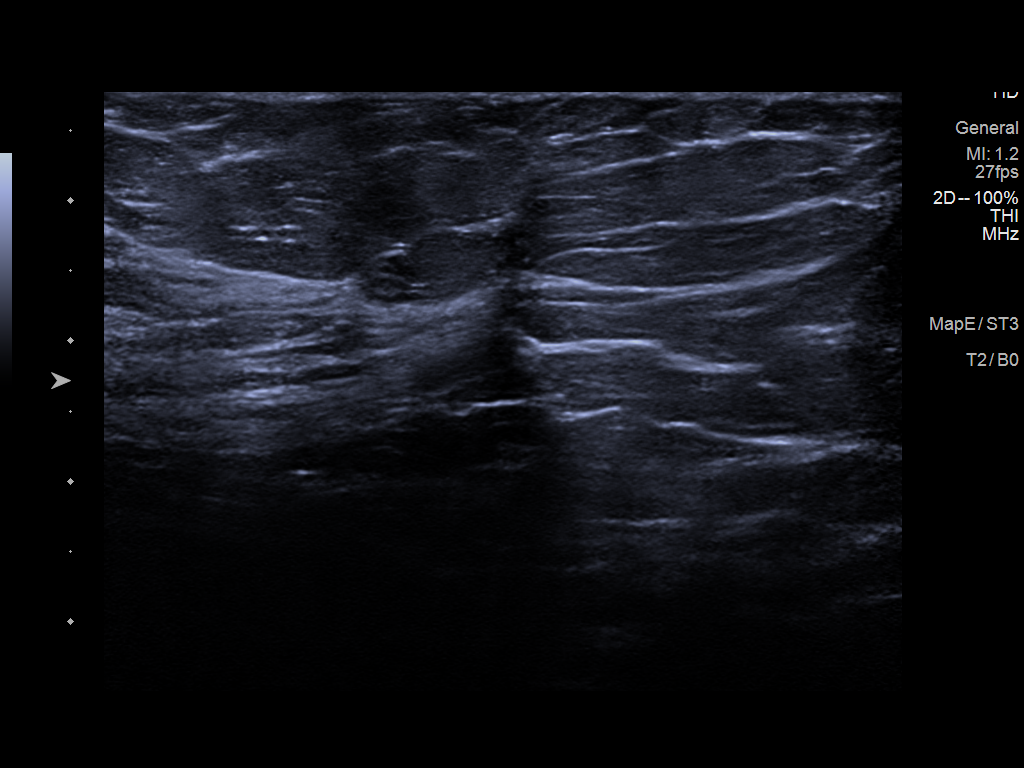

[7 of 7 positions shown; findings below may reference images not displayed]

FINDINGS: Ultrasound of the lateral aspect of the left breast demonstrates
normal fibroglandular tissue. There is a prominent duct in the left
breast at 3 o'clock filled with simple fluid. No intraductal masses
are identified.
IMPRESSION: Benign ductal dilation in the lateral left breast. No suspicious
targeted sonographic findings to correspond with the patient's pain.

RECOMMENDATION:
1. Clinical follow-up recommended for the left breast pain. Any
further workup should be based on clinical grounds.

2. Screening mammogram at age 38 unless there are persistent or
intervening clinical concerns. (Code:[email or web address])

I have discussed the findings and recommendations with the patient.
If applicable, a reminder letter will be sent to the patient
regarding the next appointment.

BI-RADS CATEGORY  2: Benign.

## 2024-06-30 ENCOUNTER — Other Ambulatory Visit: Payer: Self-pay | Admitting: Student

## 2024-06-30 ENCOUNTER — Encounter: Payer: Self-pay | Admitting: Student

## 2024-06-30 DIAGNOSIS — N611 Abscess of the breast and nipple: Secondary | ICD-10-CM

## 2024-06-30 DIAGNOSIS — N644 Mastodynia: Secondary | ICD-10-CM

## 2024-07-06 ENCOUNTER — Ambulatory Visit
Admission: RE | Admit: 2024-07-06 | Discharge: 2024-07-06 | Disposition: A | Discharge status: Home / Self Care | Source: Ambulatory Visit | Attending: Student | Admitting: Student

## 2024-07-06 ENCOUNTER — Inpatient Hospital Stay
Admission: RE | Admit: 2024-07-06 | Discharge: 2024-07-06 | Discharge status: Home / Self Care | Source: Ambulatory Visit | Attending: Student

## 2024-07-06 ENCOUNTER — Other Ambulatory Visit
Admission: RE | Admit: 2024-07-06 | Discharge: 2024-07-06 | Disposition: A | Discharge status: Home / Self Care | Source: Ambulatory Visit | Attending: Student | Admitting: Student

## 2024-07-06 DIAGNOSIS — N644 Mastodynia: Secondary | ICD-10-CM

## 2024-07-06 DIAGNOSIS — N611 Abscess of the breast and nipple: Secondary | ICD-10-CM | POA: Diagnosis present

## 2024-07-06 MED ORDER — LIDOCAINE HCL 1 % IJ SOLN
10.0000 mL | Freq: Once | INTRAMUSCULAR | Status: AC
  Administered 2024-07-06: 5 mL
  Filled 2024-07-06: qty 10

## 2024-07-10 LAB — BODY FLUID CULTURE W GRAM STAIN: Culture: NO GROWTH

## 2024-09-19 ENCOUNTER — Encounter: Payer: Self-pay | Admitting: Student

## 2024-09-20 ENCOUNTER — Other Ambulatory Visit: Payer: Self-pay | Admitting: Internal Medicine

## 2024-09-20 DIAGNOSIS — R599 Enlarged lymph nodes, unspecified: Secondary | ICD-10-CM

## 2024-09-26 ENCOUNTER — Ambulatory Visit
Admission: RE | Admit: 2024-09-26 | Discharge: 2024-09-26 | Disposition: A | Discharge status: Home / Self Care | Source: Ambulatory Visit | Attending: Internal Medicine | Admitting: Internal Medicine

## 2024-09-26 DIAGNOSIS — R599 Enlarged lymph nodes, unspecified: Secondary | ICD-10-CM | POA: Insufficient documentation
